# Patient Record
Sex: Male | Born: 1950 | Race: White | Hispanic: No | Marital: Married
Health system: Midwestern US, Academic
[De-identification: ages and names within clinical notes are randomized; demographics above are authoritative.]

---

## 2021-12-24 ENCOUNTER — Encounter: Admit: 2021-12-24 | Discharge: 2021-12-24 | Payer: MEDICARE

## 2021-12-24 NOTE — Telephone Encounter
Attempted to reach pt to offer sooner appt than Oct 12th  Unable to reach pt on [phone]  Called spouse line - asked her to have Iona Beard call when available   Provided contact #    sb

## 2022-03-07 ENCOUNTER — Encounter: Admit: 2022-03-07 | Discharge: 2022-03-07 | Payer: MEDICARE

## 2022-03-07 ENCOUNTER — Ambulatory Visit: Admit: 2022-03-07 | Discharge: 2022-03-07 | Payer: MEDICARE

## 2022-03-07 DIAGNOSIS — I10 Essential (primary) hypertension: Secondary | ICD-10-CM

## 2022-03-07 DIAGNOSIS — H0289 Other specified disorders of eyelid: Secondary | ICD-10-CM

## 2022-03-07 DIAGNOSIS — I2510 Atherosclerotic heart disease of native coronary artery without angina pectoris: Secondary | ICD-10-CM

## 2022-03-07 DIAGNOSIS — S92901A Unspecified fracture of right foot, initial encounter for closed fracture: Secondary | ICD-10-CM

## 2022-03-07 DIAGNOSIS — C801 Malignant (primary) neoplasm, unspecified: Secondary | ICD-10-CM

## 2022-03-07 DIAGNOSIS — C44111 Basal cell carcinoma of skin of unspecified eyelid, including canthus: Secondary | ICD-10-CM

## 2022-03-07 NOTE — Progress Notes
There is no height or weight on file to calculate BMI.             Assessment and Plan:              Left medial canthal BCC  Recurrence  Need to check path report or just re biopsy    Plan for MOHS at [facility] for reconstruction    Hold smoking  May need to stay on blood thinner      Discussed loss of tear duct

## 2022-03-07 NOTE — Patient Instructions
POST OPERATIVE INSTRUCTIONS  EYELID SURGERY    Most procedures are performed with local anesthesia with intravenous sedation.  While post operative nausea is rare with this type of anesthesia, it is wise to start your diet by drinking clear liquids after surgery (e.g., 7-Up, Gatorade, ginger ale, etc.).  If clear liquids are tolerated well without nausea or vomiting, you may advance towards a regular diet.  Avoid ?fatty foods? (e.g., milk, pizza, hamburgers, etc.) on the day of surgery or as long as nausea persists.      Many eyelid procedures only require Extra Strength Tylenol for postoperative pain control.  For those patients with more extensive eyelid reconstruction, a prescription for a pain reliever is often given.  Patients may choose to take the prescribed pain reliever OR Extra Strength Tylenol, BUT NOT both together.  Unless specifically instructed, aspirin products such as Bayer, Bufferin, Anacin, Excedrin, etc., should be avoided for at least one week after surgery.  This also includes Advil, Nuprin, Motrin and Ibuprofen.  If using Aspirin or other blood thinners for medical reasons, resume the day after surgery.    Whenever lying down or sleeping, keep your head elevated on 2 or 3 pillows such that your head is always elevated above the level of your chest.      Apply cold compress to surgical site as much as possible for 2 to3 days following surgery.  A folded washcloth soaked in ice-cold water and wrung out works well for this.  A bag of frozen peas also works well as it is lightweight but molds to the eye.  Do not apply ice directly to the skin.  A prescription for an eye ointment should be provided after surgery.  This is to be gently applied to the stitches twice daily.  If the eyes feel irritated, the ointment is safe to use in the eye for lubrication.  This will likely result in blurred vision but will go away once the ointment is stopped.    EXCEPTION:  If a patch is taped over the eye, do NOT apply cold compresses or ointment.  Leave the patch undisturbed.  A physician will remove the patch at your first post operative visit.      Restrictions:  No lifting (more than 20lbs), straining, or bending over at the waist,heavy exercise (no straw use or nose blowing for lacrimal or orbital surgery). Do not rub the eye.       If your surgery was done on an outpatient basis, you should receive a phone call the day after surgery to check on your progress and to arrange for a post operative clinic appointment.  The first post operative visit will be one to two weeks after surgery to check the incisions and remove sutures if necessary.  A second post operative  visit six to eight weeks after surgery will assess the final surgical result.     The following problems should be reported to the office as soon as possible:  Continuous, brisk bleeding.  Please note that some oozing or drainage is common following a surgical procedure.  Do not try to clean dried blood from the surgical site.   This will likely only create more bleeding.  Temperature (fever) over 101?Marland Kitchen  Excessive pain at surgical site not relieved by the pain medication, especially if associated with protrusion of the eyeball.  Sudden loss of vision.  Please note that some blurriness is expected after surgery due to the ointment used around the eyes.  All patients should be able to check their vision even with eyelid swelling.  Double vision    If a problem should arise or you have a question, I can be reached at the following number:  Office- [phone]    I hope that your surgery experience with [place] is a positive one.  Please contact my office with any questions.      Danny Bell A. Wilma Flavin, MD

## 2022-03-08 ENCOUNTER — Encounter: Admit: 2022-03-08 | Discharge: 2022-03-08 | Payer: MEDICARE

## 2022-03-08 ENCOUNTER — Ambulatory Visit: Admit: 2022-03-08 | Discharge: 2022-03-08 | Payer: MEDICARE

## 2022-03-08 DIAGNOSIS — C441191 Basal cell carcinoma of skin of left upper eyelid, including canthus: Secondary | ICD-10-CM

## 2022-03-08 DIAGNOSIS — H0289 Other specified disorders of eyelid: Secondary | ICD-10-CM

## 2022-03-08 DIAGNOSIS — C44111 Basal cell carcinoma of skin of unspecified eyelid, including canthus: Secondary | ICD-10-CM

## 2022-03-18 ENCOUNTER — Encounter: Admit: 2022-03-18 | Discharge: 2022-03-18 | Payer: MEDICARE

## 2022-03-18 NOTE — Telephone Encounter
03/17/22 - Call was transferred to surgery scheduling line.   McKenzie from Dr. Eddie Dibbles office called to let Dr. Michaela Corner know that Dr. Rex Kras would like Dr. Michaela Corner to call her regarding Mr. Wiswell. She says Dr. Rex Kras approved hold of Plavix for 5 days and asa for 7 days.  Dr. Rex Kras may be reached at [phone] (office) or [phone].    03/17/22 at 11:57 am - Returned call to office - spoke with Dr. Rex Kras - explained that Dr. Michaela Corner is in surgery today and will be happy to relay a message and have him call her.   She says no need to call, but she wants him to know that Mr. Steiner has not had cardiologist following him for many years as he is non-compliant.   She is very concerned for his high risk of a cardio event.  She does think it is okay to proceed with surgery since it will be in a hospital setting, but she wanted to give Dr. Michaela Corner full disclosure of the likelihood of a cardio event.      04/07/22 - Relayed phone call and risk assessment to Dr. Michaela Corner.   Will plan to proceed with surgery as scheduled for 04/06/22 for now.  sb

## 2022-03-24 ENCOUNTER — Encounter: Admit: 2022-03-24 | Discharge: 2022-03-24 | Payer: MEDICARE

## 2022-03-24 ENCOUNTER — Ambulatory Visit: Admit: 2022-03-24 | Discharge: 2022-03-25 | Payer: MEDICARE

## 2022-03-24 DIAGNOSIS — I10 Essential (primary) hypertension: Secondary | ICD-10-CM

## 2022-03-24 DIAGNOSIS — N189 Chronic kidney disease, unspecified: Secondary | ICD-10-CM

## 2022-03-24 DIAGNOSIS — J449 Chronic obstructive pulmonary disease, unspecified: Secondary | ICD-10-CM

## 2022-03-24 DIAGNOSIS — S92901A Unspecified fracture of right foot, initial encounter for closed fracture: Secondary | ICD-10-CM

## 2022-03-24 DIAGNOSIS — C801 Malignant (primary) neoplasm, unspecified: Secondary | ICD-10-CM

## 2022-03-24 DIAGNOSIS — R569 Unspecified convulsions: Secondary | ICD-10-CM

## 2022-03-24 NOTE — Pre-Anesthesia Patient Instructions
PREPROCEDURE INFORMATION    Arrival at the hospital  [facility]  [address]  [place], [place] [address]    Park in the [employer], located directly across from the main entrance to the hospital.  Enter through the ground floor main hospital entrance and check in at the Information Desk in the lobby.  They will validate your parking ticket and direct you to the next location.  If you are a woman between the ages of 40 and 74, and have not had a hysterectomy, you will be asked for a urine sample prior to surgery.  Please do not urinate before arriving in the Surgery Waiting Room.  Once there, check in and let the attendant know if you need to provide a sample.    You will receive a call with your surgery arrival time between 2:30pm and 4:30pm the last business day before your procedure.  If you do not receive a call, please call [phone] before 4:30pm or [phone] after 4:30pm.    Eating or drinking before surgery  Do not eat or drink anything after 11:00 p.m. the day before your procedure (including gum, mints, candy, or chewing tobacco) OR follow the specific instructions you were given by your Surgeon.  You may have WATER ONLY up to 2 hours before arriving at the hospital.    Planning transportation for outpatient procedure  For your safety, you will need to arrange for a responsible ride/person to accompany you home due to sedation or anesthesia with your procedure.  An [employer] Goad, taxi or other public transportation driver is not considered a responsible person to accompany you home.    Bath/Shower Instructions  Take a bath or shower with antibacterial soap the night before and the morning of your procedure. Use clean towels.  Put on clean clothes after bath or shower.  Avoid using lotion and oils.  If you are having surgery above the waist, wear a shirt that fastens up the front.  Sleep on clean sheets if bath or shower is done the night before procedure.    Morning of your procedure:  Brush your teeth and tongue  Do not smoke, vape, chew or user any tobacco products.  Do not shave the area where you will have surgery.  Remove nail polish, makeup and all jewelry (including piercings) before coming to the hospital.  Dress in clean, loose, comfortable clothing.    Valuables  Leave money, credit cards, jewelry, and any other valuables at home. The [facility] is not responsible for the loss or breakage of personal items.    What to bring to the hospital  ID/Insurance card  Medical Device card  Official documents for legal guardianship  Copy of your Living Will, Advanced Directives, and/or Durable Power of Attorney. If you have these documents, please bring them to the admissions office on the day of your surgery to be scanned into your records.  Do not bring medications from home unless instructed by a pharmacist.  CPAP/BiPAP machine (including all supplies)  Walker, cane, or motorized scooter  Cases for glasses/hearing aids/contact lens (bring solutions for contacts)  Stimulator remote  Insulin pump and supplies as directed by pharmacy     Notify us at [facility]: [phone] on the day of your procedure if:  You need to cancel your procedure.  You are going to be late.    Notify your surgeon if:  There is a possibility that you are pregnant.  You  become ill with a cough, fever, sore throat, nausea, vomiting or flu-like symptoms.  You have any open wounds/sores that are red, painful, draining, or are new since you last saw the doctor.  You need to cancel your procedure.    Preparing to get your medications at discharge  Your surgeon may prescribe you medications to take after your procedure.  If you like the convenience of having your medications filled here at [facility], please do the following:  Go to [facility] pharmacy after your [facility] appointment to put a credit card on file.  Call [facility] pharmacy at [phone] (Monday-Friday 7am-9pm or Saturday and Sunday 9am-5pm) to put a credit card on file.  Bring a credit card or cash on the day of your procedure- please leave with a family member rather than bringing it into the preop area.    Current Visitor Policy:  Visitors must be free of fever and symptoms to be in our facilities.  No more than 2 visitors per patient are allowed.  Additional guidelines may vary, based on patient care area or patient's condition.  Patients in semiprivate rooms may have visitors, but visits should be coordinated so only two total visitors are in a room at a time due to space limitations.  Children younger than age 44 are allowed to visit inpatients.    Thank you for participating in your Preoperative Assessment Clinic visit today.    If you have any changes to your health or hospitalizations between now and your surgery, please call us at [phone] for Main instructions.    Instructions given to patient via: MyChart and verbal

## 2022-03-24 NOTE — Anesthesia Pre-Procedure Evaluation
Anesthesia Pre-Procedure Evaluation    Name: Danny Bell      MRN: [id number]     DOB: 25-Dec-1950     Age: 71 y.o.     Sex: male   _________________________________________________________________________     Procedure Info:   Procedure Information     Date/Time: 04/06/22 1240    Procedures:       ADJACENT TISSUE TRANSFER PEDICLE FLAP DEFECT 10 SQ CM OR LESS - EYELID/ EAR/ NOSE/ LIP (Left) - 1 HR      EXCISION/ REPAIR EYELID INVOLVING LID MARGIN/ TARSUS/ CONJUNCTIVA/ CANTHUS/ FULL THICKNESS - GREATER THAN 1/4 LID MARGIN (Left)      RECONSTRUCTION EYELID - FULL THICKNESS BY TRANSFER TARSONCONJUNCTIVAL FLAP FROM OPPOSING EYELID - 2/3 OR LESS EYELID - 1/ FIRST STAGE (Left)      TISSUE TRANSFER MUSCLE/ MYOCUTANEOUS/ FASCIOCUTANEOUS FLAP WITH VASCULAR PEDICLE - HEAD/ NECK (Left)    Location: MAIN OR 08 / Main OR/Periop    Surgeons: Ezequiel Essex, MD          Physical Assessment  Vital Signs (last filed in past 24 hours):         Patient History   Allergies   Allergen Reactions   ? Cephalexin UNKNOWN        Current Medications    Medication Directions   aspirin 325 mg tablet Take one tablet by mouth daily.   atorvastatin (LIPITOR) 80 mg tablet Take one tablet by mouth at bedtime daily.   clopiDOGreL (PLAVIX) 75 mg tablet Take one tablet by mouth daily.   fluticasone propionate (ALLERGY RELIEF (FLUTICASONE)) 50 mcg/actuation nasal spray, suspension    levETIRAcetam 500 mg/5 mL (5 mL) soln    losartan (COZAAR) 100 mg tablet Take one tablet by mouth daily.   metoprolol tartrate (LOPRESSOR) 50 mg tablet Take one tablet by mouth twice daily.       Review of Systems/Medical History      Patient summary reviewed    PONV Screening: Non-smoker  No history of anesthetic complications  No family history of anesthetic complications      Airway - negative        Pulmonary      Current smoker          tobacco use      COPD (does not use inhalers)      No pulmonary embolus      No recent URI        Obstructive Sleep Apnea:  *STOP-BANG Score: 5.      Cardiovascular         Exercise tolerance: >4 METS       Beta Blocker therapy: Yes      Hypertension (~180/100; PCP added amlodipine 2.5mg  on 03/22/22 ),     No valvular problems/murmurs          No past MI:      No angina      PVD (lower extremity stent x 2; RLE bypass)      DVT (LLE DVT prior to 2017; unprovoked )      No indications/hx of CHF        Dyspnea on exertion      Follows with Dr. 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          Diagnostic Tests  Hematology: No results found for: HGB, HCT, PLTCT, WBC, NEUT, ANC, LYMPH, ALC, ABSLYMPHCT, MONA, AMC, EOSA, ABC, BASOPHILS, MCV, MCH, MCHC, MPV, RDW      General Chemistry: No results found for: NA, K, CL, CO2, GAP, BUN, CR, GLU, CA, KETONES, ALBUMIN, LACTIC, OBSCA, MG, TOTBILI, TOTBILCB, PO4   Coagulation: No results found for: PT, PTT, INR    PAC Plan    Interview: Telehealth Interview    ASA Score: 3    Anesthesia Options Discussed: General      Prior Records Requested: (Dr. Callie Fielding - cardiologist; Dr. Clarene Duke - PCP OVN, labs )  Office Visit, Echo, Stress Test, ECG and Labs                  [facility] RISK ASSESSMENTS:   *Duke Activity Status Index (DASI): No data recorded  , Calculated METS: 6.79   (score < 25 correlates with increased risk for death, MI, and moderate to severe complications, max score 57)  *STOP-BANG Score: 5   (total 5-8 high risk for OSA, 3-4 with HCO3 >28 also high risk. OSA associated with greater than twice the odds for respiratory failure, cardiac events, ICU admission, and difficult intubation)    Patient's Revised Cardiac Risk Index (RCRI) score: 1 point = 0.9% risk of complications including but not limited to MI, pulmonary edema, VF, cardiac arrest, complete heart block, death.      PLAN

## 2022-03-28 ENCOUNTER — Encounter: Admit: 2022-03-28 | Discharge: 2022-03-28 | Payer: MEDICARE

## 2022-03-28 NOTE — Telephone Encounter
MOHS SURGERY PRE-OP PHONE CONSULTATION:    Preoperative phone consultation completed for upcoming Mohs surgery with Dr. Arlana Pouch on 04/05/2022.    I called the patient and reviewed with the patient what Mohs surgery is and what the day of the procedure/days following the procedure will look like. Informed the patient that he/she may drive to and from the procedure and can eat breakfast prior. Instructed to bring book/magazine or something to work on as procedure can take several hours. Okay to bring family member or friend. I answered all patient questions and concerns. Left patient with direct nurse line for patient to call back if any future questions or concerns arise.    Diagnosis: Basal cell carcinoma   Site: left medial canthus      Coordinated closure: Yes  If coordinated closure needed: specialty, physician, and date of closure: Dr. Wilma Flavin with [facility] next day 04/06/22  History of Mohs previously: No  Bleeding disorder: No   Use of blood thinners: Yes   Pacemaker/Defibrillator: No  History of joint replacement in the last 2 years and/or heart valve replacement: No  Are all allergies on file: Yes  Are all medications on file: Yes  Does patient live in SNF: No  Is the patient able to sign consent for him/herself: Yes  Accommodations needed: No  If accommodations needed, type of accommodations: N/A  Additional notes: None    CBC w diff    No results found for: WBC, RBC, HGB, HCT, MCV, MCH, MCHC, RDW, PLTCT, MPV No results found for: NEUT, ANC, LYMA, ALC, MONA, AMC, EOSA, AEC, BASA, ABC     Theodis Blaze, RN

## 2022-04-05 ENCOUNTER — Encounter: Admit: 2022-04-05 | Discharge: 2022-04-05 | Payer: MEDICARE

## 2022-04-05 ENCOUNTER — Ambulatory Visit: Admit: 2022-04-05 | Discharge: 2022-04-05 | Payer: MEDICARE

## 2022-04-06 ENCOUNTER — Encounter: Admit: 2022-04-06 | Discharge: 2022-04-06 | Payer: MEDICARE

## 2022-04-06 ENCOUNTER — Ambulatory Visit: Admit: 2022-04-06 | Discharge: 2022-04-06 | Payer: MEDICARE

## 2022-04-06 DIAGNOSIS — N189 Chronic kidney disease, unspecified: Secondary | ICD-10-CM

## 2022-04-06 DIAGNOSIS — S92901A Unspecified fracture of right foot, initial encounter for closed fracture: Secondary | ICD-10-CM

## 2022-04-06 DIAGNOSIS — J449 Chronic obstructive pulmonary disease, unspecified: Secondary | ICD-10-CM

## 2022-04-06 DIAGNOSIS — C801 Malignant (primary) neoplasm, unspecified: Secondary | ICD-10-CM

## 2022-04-06 DIAGNOSIS — I10 Essential (primary) hypertension: Secondary | ICD-10-CM

## 2022-04-06 DIAGNOSIS — R569 Unspecified convulsions: Secondary | ICD-10-CM

## 2022-04-06 MED ORDER — ONDANSETRON HCL (PF) 4 MG/2 ML IJ SOLN
INTRAVENOUS | 0 refills | Status: DC
Start: 2022-04-06 — End: 2022-04-06
  Administered 2022-04-06: 17:00:00 4 mg via INTRAVENOUS

## 2022-04-06 MED ORDER — LIDOCAINE (PF) 200 MG/10 ML (2 %) IJ SYRG
INTRAVENOUS | 0 refills | Status: DC
Start: 2022-04-06 — End: 2022-04-06
  Administered 2022-04-06: 16:00:00 100 mg via INTRAVENOUS

## 2022-04-06 MED ORDER — FENTANYL CITRATE (PF) 50 MCG/ML IJ SOLN
INTRAVENOUS | 0 refills | Status: DC
Start: 2022-04-06 — End: 2022-04-06
  Administered 2022-04-06 (×4): 25 ug via INTRAVENOUS

## 2022-04-06 MED ORDER — CLINDAMYCIN IN 5 % DEXTROSE 900 MG/50 ML IV PGBK
INTRAVENOUS | 0 refills | Status: DC
Start: 2022-04-06 — End: 2022-04-06
  Administered 2022-04-06: 16:00:00 900 mg via INTRAVENOUS

## 2022-04-06 MED ORDER — DEXAMETHASONE SODIUM PHOSPHATE 4 MG/ML IJ SOLN
INTRAVENOUS | 0 refills | Status: DC
Start: 2022-04-06 — End: 2022-04-06
  Administered 2022-04-06: 17:00:00 4 mg via INTRAVENOUS

## 2022-04-06 MED ORDER — PROPOFOL INJ 10 MG/ML IV VIAL
INTRAVENOUS | 0 refills | Status: DC
Start: 2022-04-06 — End: 2022-04-06
  Administered 2022-04-06: 16:00:00 120 mg via INTRAVENOUS
  Administered 2022-04-06 (×2): 30 mg via INTRAVENOUS

## 2022-04-06 MED ORDER — ARTIFICIAL TEARS (PF) SINGLE DOSE DROPS GROUP
OPHTHALMIC | 0 refills | Status: DC
Start: 2022-04-06 — End: 2022-04-06
  Administered 2022-04-06: 16:00:00 1 [drp] via OPHTHALMIC

## 2022-04-06 MED ORDER — LABETALOL 5 MG/ML IV SYRG
INTRAVENOUS | 0 refills | Status: DC
Start: 2022-04-06 — End: 2022-04-06
  Administered 2022-04-06 (×2): 10 mg via INTRAVENOUS

## 2022-04-06 MED ADMIN — TETRACAINE HCL 0.5 % OP DROP [7795]: 2 [drp] | OPHTHALMIC | @ 17:00:00 | Stop: 2022-04-06 | NDC 68682092064

## 2022-04-06 MED ADMIN — BUPIVACAINE (PF) 0.5 % (5 MG/ML) IJ SOLN [87867]: 10 mL | INTRAMUSCULAR | @ 17:00:00 | Stop: 2022-04-06 | NDC 00409116201

## 2022-04-06 MED ADMIN — ERYTHROMYCIN 5 MG/GRAM (0.5 %) OP OINT [2888]: 0.5 [in_us] | OPHTHALMIC | @ 17:00:00 | Stop: 2022-04-06 | NDC 24208091019

## 2022-04-06 MED ADMIN — LEVETIRACETAM 500 MG/5 ML IV SOLN [129831]: 1000 mg | INTRAVENOUS | @ 19:00:00 | Stop: 2022-04-06 | NDC 51224001301

## 2022-04-06 MED ADMIN — ACETAMINOPHEN 1,000 MG/100 ML (10 MG/ML) IV SOLN [305632]: 1000 mg | INTRAVENOUS | @ 19:00:00 | Stop: 2022-04-06 | NDC 24201010001

## 2022-04-06 MED ADMIN — LIDOCAINE-EPINEPHRINE 2 %-1:100,000 IJ SOLN [15954]: 10 mL | INTRAMUSCULAR | @ 17:00:00 | Stop: 2022-04-06 | NDC 63323048303

## 2022-04-06 MED ADMIN — SODIUM CHLORIDE 0.9 % IV SOLP [27838]: 1000 mL | INTRAVENOUS | @ 15:00:00 | Stop: 2022-04-06 | NDC 00338004904

## 2022-04-06 MED ADMIN — HYDROMORPHONE (PF) 2 MG/ML IJ SYRG [163476]: 0.5 mg | INTRAVENOUS | @ 19:00:00 | Stop: 2022-04-06 | NDC 00409131203

## 2022-04-06 MED ADMIN — BALANCED SALT SOLN NO.2 IRRIG. IO SOLN [37578]: 15 mL | OPHTHALMIC | @ 17:00:00 | Stop: 2022-04-06 | NDC 00065079515

## 2022-04-06 MED ADMIN — HYDROMORPHONE (PF) 2 MG/ML IJ SYRG [163476]: 0.5 mg | INTRAVENOUS | @ 20:00:00 | Stop: 2022-04-06 | NDC 00409131203

## 2022-04-07 ENCOUNTER — Encounter: Admit: 2022-04-07 | Discharge: 2022-04-07 | Payer: MEDICARE

## 2022-04-07 NOTE — Progress Notes
Called for post op check   Doing well  vision is ok  Pain controlled   Questions answered

## 2022-04-08 ENCOUNTER — Encounter: Admit: 2022-04-08 | Discharge: 2022-04-08 | Payer: MEDICARE

## 2022-04-08 DIAGNOSIS — J449 Chronic obstructive pulmonary disease, unspecified: Secondary | ICD-10-CM

## 2022-04-08 DIAGNOSIS — C801 Malignant (primary) neoplasm, unspecified: Secondary | ICD-10-CM

## 2022-04-08 DIAGNOSIS — R569 Unspecified convulsions: Secondary | ICD-10-CM

## 2022-04-08 DIAGNOSIS — N189 Chronic kidney disease, unspecified: Secondary | ICD-10-CM

## 2022-04-08 DIAGNOSIS — I10 Essential (primary) hypertension: Secondary | ICD-10-CM

## 2022-04-08 DIAGNOSIS — S92901A Unspecified fracture of right foot, initial encounter for closed fracture: Secondary | ICD-10-CM

## 2022-04-14 ENCOUNTER — Encounter: Admit: 2022-04-14 | Discharge: 2022-04-14 | Payer: MEDICARE

## 2022-04-14 ENCOUNTER — Ambulatory Visit: Admit: 2022-04-14 | Discharge: 2022-04-14 | Payer: MEDICARE

## 2022-04-14 DIAGNOSIS — C44111 Basal cell carcinoma of skin of unspecified eyelid, including canthus: Secondary | ICD-10-CM

## 2022-04-14 NOTE — Progress Notes
There is no height or weight on file to calculate BMI.             Assessment and Plan:            s/p Left <MOHs  May need to thin flap

## 2022-07-06 ENCOUNTER — Encounter: Admit: 2022-07-06 | Discharge: 2022-07-06 | Payer: MEDICARE

## 2023-05-12 ENCOUNTER — Encounter: Admit: 2023-05-12 | Discharge: 2023-05-12 | Payer: MEDICARE

## 2024-07-16 IMAGING — CR XR Chest 2 Views
2 series · 2 of 2 positions shown · non-contrast
Comparison: 09/01/2015.

OBSERVATION:
EXAM DESCRIPTION: Chest 2V
HISTORY: Preop basal cell carcinoma left eyelid.
TECHNIQUE: PA and lateral views of the chest were obtained

[chest pa]
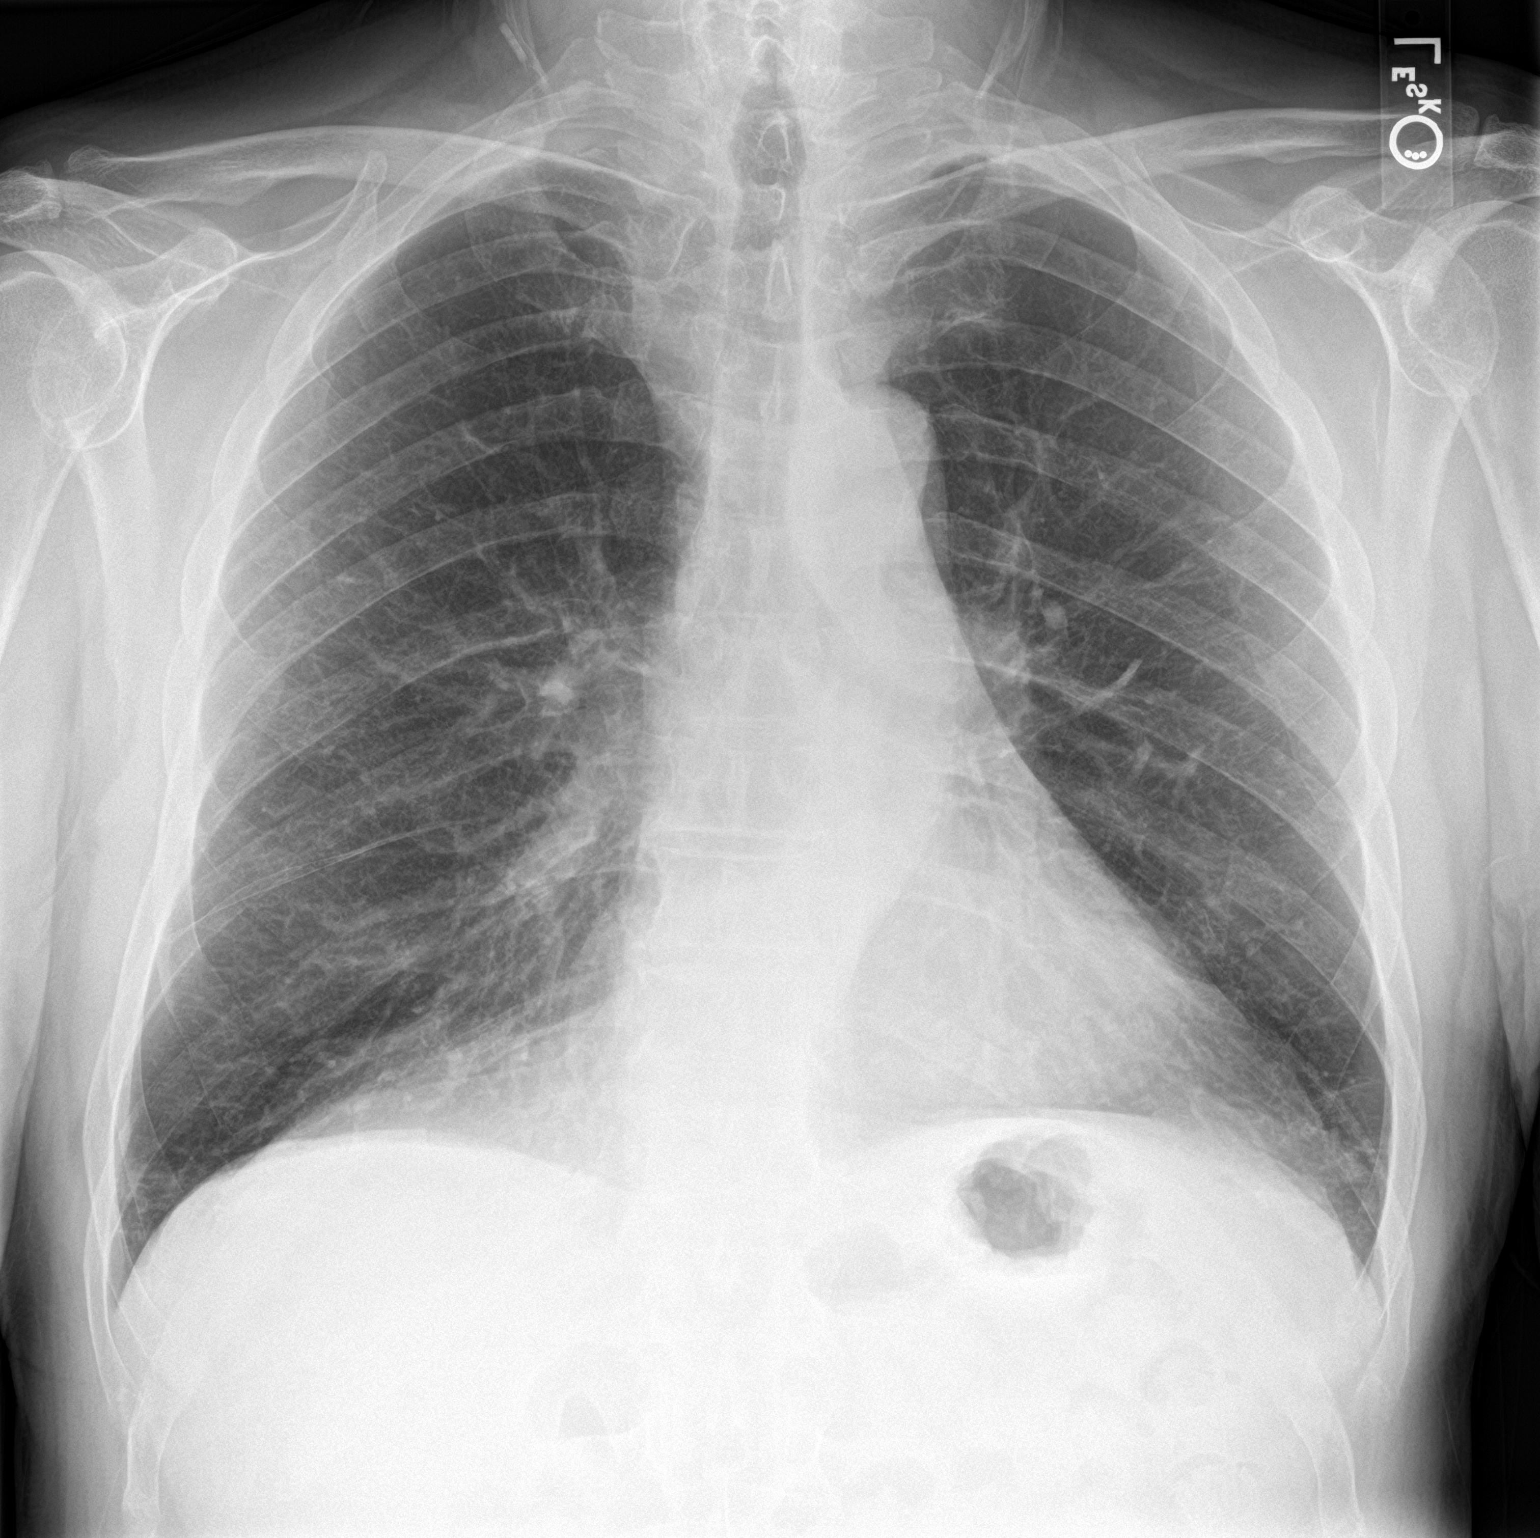

[chest lat]
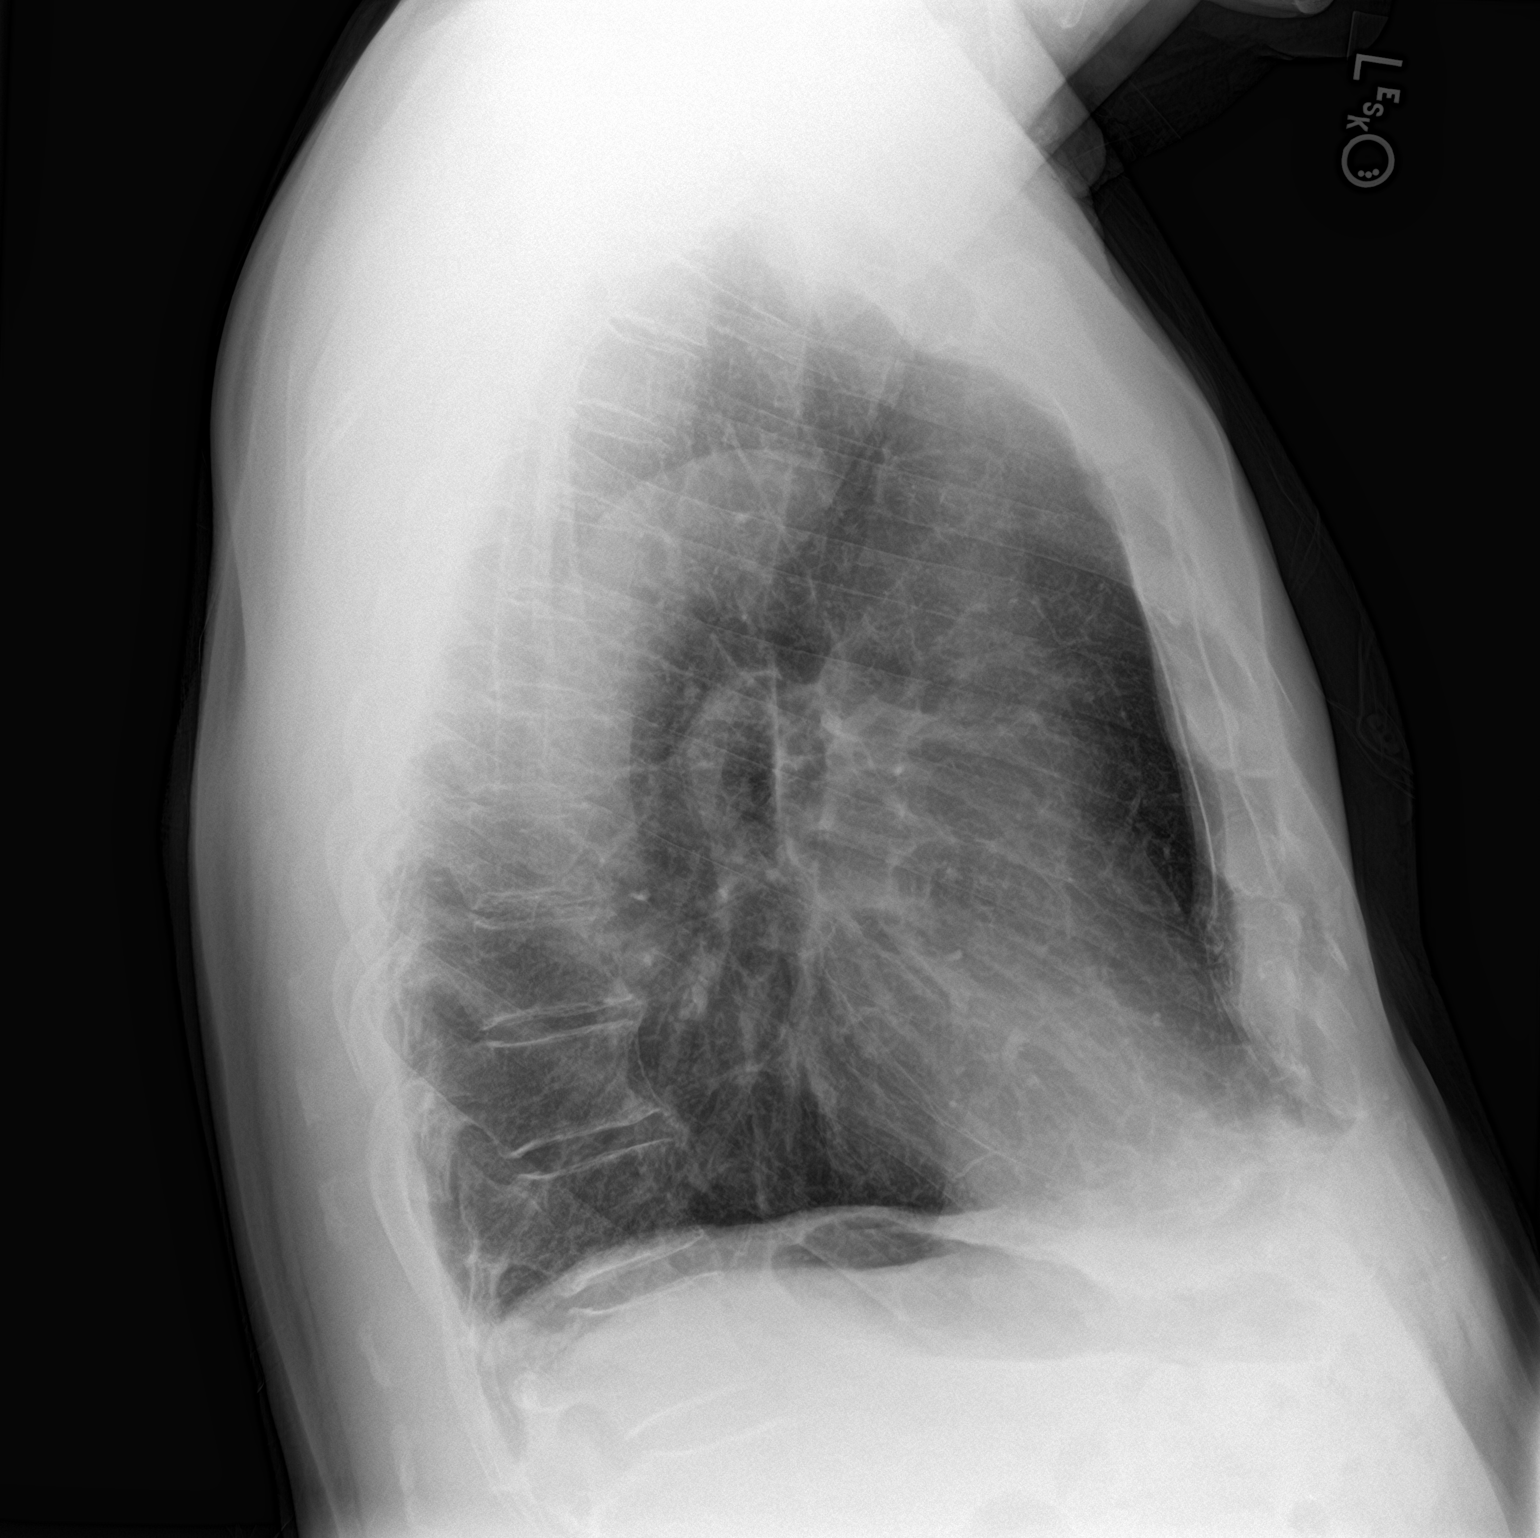

[2 of 2 positions shown; findings below may reference images not displayed]

FINDINGS: The heart size and pulmonary vasculature are within normal limits. Mild
air trapping is noted. There is mild parahilar and bibasilar scarring/
atelectasis, right more than left. No focal airspace consolidation. No
pleural effusion. No pneumothorax. No acute osseous abnormality
identified.
IMPRESSION: Air trapping and mild chronic lung disease.
